# Patient Record
Sex: Female | Born: 1975 | Race: White | Hispanic: No | Marital: Married | Smoking: Never smoker
Health system: Southern US, Community
[De-identification: ages and names within clinical notes are randomized; demographics above are authoritative.]

## PROBLEM LIST (undated history)

## (undated) HISTORY — PX: APPENDECTOMY: SHX54

---

## 2001-12-07 HISTORY — PX: WISDOM TOOTH EXTRACTION: SHX21

## 2007-08-31 ENCOUNTER — Encounter: Payer: Self-pay | Admitting: Specialist

## 2007-09-07 ENCOUNTER — Encounter: Payer: Self-pay | Admitting: Specialist

## 2007-10-08 ENCOUNTER — Encounter: Payer: Self-pay | Admitting: Specialist

## 2009-07-05 ENCOUNTER — Ambulatory Visit: Payer: Self-pay | Admitting: Obstetrics & Gynecology

## 2009-07-05 LAB — CONVERTED CEMR LAB
Eosinophils Relative: 3 % (ref 0–5)
Lymphocytes Relative: 34 % (ref 12–46)
Lymphs Abs: 1.5 10*3/uL (ref 0.7–4.0)
MCHC: 31.9 g/dL (ref 30.0–36.0)
MCV: 89.4 fL (ref 78.0–100.0)
Monocytes Absolute: 0.4 10*3/uL (ref 0.1–1.0)
Neutrophils Relative %: 54 % (ref 43–77)
Platelets: 278 10*3/uL (ref 150–400)
RBC: 4.42 M/uL (ref 3.87–5.11)
RDW: 14.2 % (ref 11.5–15.5)

## 2009-07-19 ENCOUNTER — Ambulatory Visit (HOSPITAL_COMMUNITY): Admission: RE | Admit: 2009-07-19 | Discharge: 2009-07-19 | Payer: Self-pay | Admitting: Family Medicine

## 2009-07-23 ENCOUNTER — Encounter: Payer: Self-pay | Admitting: Obstetrics & Gynecology

## 2009-07-23 ENCOUNTER — Ambulatory Visit: Payer: Self-pay | Admitting: Obstetrics & Gynecology

## 2009-08-15 ENCOUNTER — Ambulatory Visit: Payer: Self-pay | Admitting: Obstetrics & Gynecology

## 2009-09-12 ENCOUNTER — Ambulatory Visit: Payer: Self-pay | Admitting: Obstetrics & Gynecology

## 2009-09-13 ENCOUNTER — Encounter: Payer: Self-pay | Admitting: Obstetrics & Gynecology

## 2009-10-01 ENCOUNTER — Ambulatory Visit (HOSPITAL_COMMUNITY): Admission: RE | Admit: 2009-10-01 | Discharge: 2009-10-01 | Payer: Self-pay | Admitting: Obstetrics and Gynecology

## 2009-10-10 ENCOUNTER — Ambulatory Visit: Payer: Self-pay | Admitting: Obstetrics & Gynecology

## 2009-10-24 ENCOUNTER — Ambulatory Visit (HOSPITAL_COMMUNITY): Admission: RE | Admit: 2009-10-24 | Discharge: 2009-10-24 | Payer: Self-pay | Admitting: Family Medicine

## 2009-11-07 ENCOUNTER — Ambulatory Visit: Payer: Self-pay | Admitting: Family Medicine

## 2009-12-05 ENCOUNTER — Ambulatory Visit: Payer: Self-pay | Admitting: Obstetrics & Gynecology

## 2009-12-05 LAB — CONVERTED CEMR LAB
HCT: 36.8 % (ref 36.0–46.0)
Hemoglobin: 12.7 g/dL (ref 12.0–15.0)
RBC: 4.36 M/uL (ref 3.87–5.11)
WBC: 7.1 10*3/uL (ref 4.0–10.5)

## 2009-12-20 ENCOUNTER — Ambulatory Visit: Payer: Self-pay | Admitting: Obstetrics & Gynecology

## 2009-12-24 ENCOUNTER — Ambulatory Visit: Payer: Self-pay | Admitting: Obstetrics & Gynecology

## 2010-01-08 ENCOUNTER — Ambulatory Visit: Payer: Self-pay | Admitting: Family Medicine

## 2010-01-23 ENCOUNTER — Ambulatory Visit: Payer: Self-pay | Admitting: Obstetrics & Gynecology

## 2010-02-06 ENCOUNTER — Ambulatory Visit: Payer: Self-pay | Admitting: Family Medicine

## 2010-02-07 ENCOUNTER — Encounter: Payer: Self-pay | Admitting: Family Medicine

## 2010-02-07 LAB — CONVERTED CEMR LAB
Chlamydia, DNA Probe: NEGATIVE
GC Probe Amp, Genital: NEGATIVE

## 2010-02-13 ENCOUNTER — Ambulatory Visit: Payer: Self-pay | Admitting: Family Medicine

## 2010-02-19 ENCOUNTER — Ambulatory Visit: Payer: Self-pay | Admitting: Family Medicine

## 2010-02-27 ENCOUNTER — Inpatient Hospital Stay (HOSPITAL_COMMUNITY): Admission: AD | Admit: 2010-02-27 | Discharge: 2010-03-01 | Payer: Self-pay | Admitting: Obstetrics & Gynecology

## 2010-02-27 ENCOUNTER — Ambulatory Visit: Payer: Self-pay | Admitting: Family

## 2010-02-27 ENCOUNTER — Ambulatory Visit: Payer: Self-pay | Admitting: Obstetrics & Gynecology

## 2010-04-17 ENCOUNTER — Ambulatory Visit: Payer: Self-pay | Admitting: Obstetrics and Gynecology

## 2011-03-02 LAB — CBC
Platelets: 268 10*3/uL (ref 150–400)
RDW: 13.7 % (ref 11.5–15.5)
WBC: 9.7 10*3/uL (ref 4.0–10.5)

## 2011-03-02 LAB — RPR: RPR Ser Ql: NONREACTIVE

## 2011-04-21 NOTE — Assessment & Plan Note (Signed)
NAME:  Heather Underwood, Heather Underwood               ACCOUNT NO.:  [id number]   MEDICAL RECORD NO.:  [id number]          PATIENT TYPE:  POB   LOCATION:  [facility]         FACILITY:  [facility]   PHYSICIAN:  Catalina Antigua, MD     DATE OF BIRTH:  Jan 22, 1976   DATE OF SERVICE:  04/17/2010                                  CLINIC NOTE   This is a 35 year old G4, P 4-0-0-4 who is status post vaginal delivery  on February 27, 2010, who presents today for a postpartum visit.  The  patient is currently without any complaints, currently bottle-feeding.  Her last period was Apr 11, 2010.  She reports receiving help from her  husband as well as family members.  The patient denies any signs or  symptoms of postpartum depression.  She has returned to work already and  is planning to use [place] for birth control.   PHYSICAL EXAMINATION:  VITAL SIGNS:  Her blood pressure is 128/80, pulse  of 81, weight of 290 pounds, height of 5 feet 7 inches.  LUNGS:  Clear to auscultation bilaterally.  HEART:  Regular rate and rhythm.  BREASTS:  Nontender, equal in size.  No engorgement.  No palpable masses  or lymphadenopathy.  ABDOMEN:  Soft, nontender, nondistended.  PELVIC:  Normal-appearing external genitalia.  Normal-appearing vaginal  mucosa and cervix.  No abnormal bleeding or discharge.  Bimanual exam,  no palpable adnexal masses or tenderness.   ASSESSMENT AND PLAN:  This is a 35 year old G4, P4, who is status post  vaginal delivery on February 27, 2010, who is here for postpartum visit.  The patient is medically cleared to resume all physical activities.  Prescription for Solmon Ice was provided.  The patient will return in  August for annual exam or p.r.n.           ______________________________  Catalina Antigua, MD     PC/MEDQ  D:  04/17/2010  T:  04/18/2010  Job:  [phone]

## 2012-02-16 IMAGING — US US OB FOLLOW-UP
1 series · 14 of 26 positions shown · non-contrast
Comparison: none

OBSTETRICAL ULTRASOUND:
 This ultrasound exam was performed in the [HOSPITAL] Ultrasound Department.  The OB US report was generated in the AS system, and faxed to the ordering physician.  This report is also available in [HOSPITAL]?s [employer] and in [REDACTED] PACS.

[Series 1: us ob follow up · 0.30mm/px · 14 of 26 slices shown]
[im 1/26]
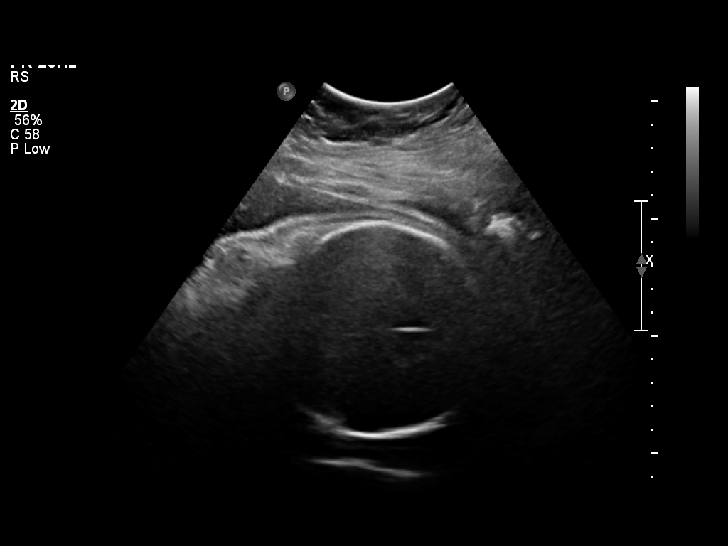
[im 3/26]
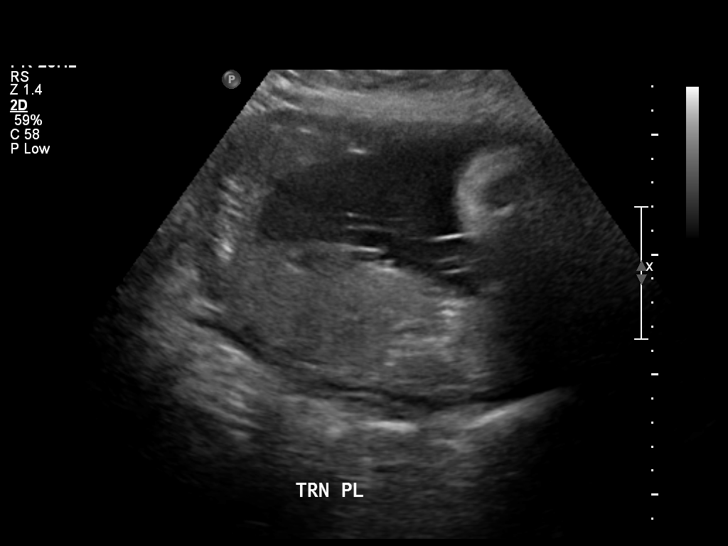
[im 5/26]
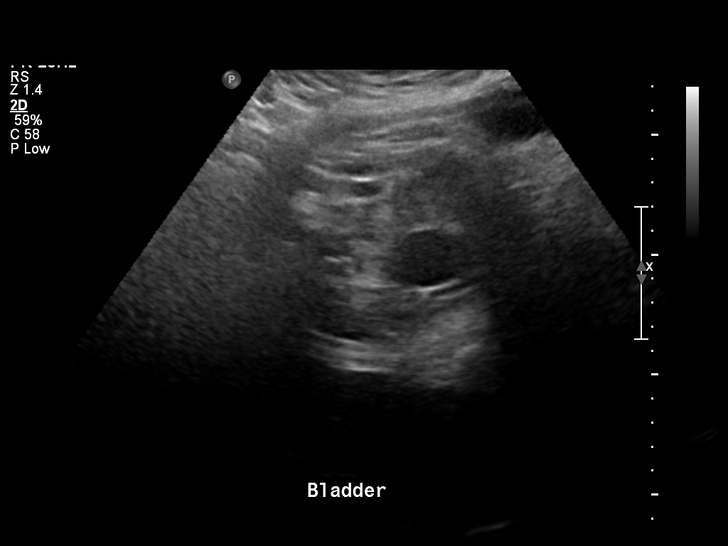
[im 7/26]
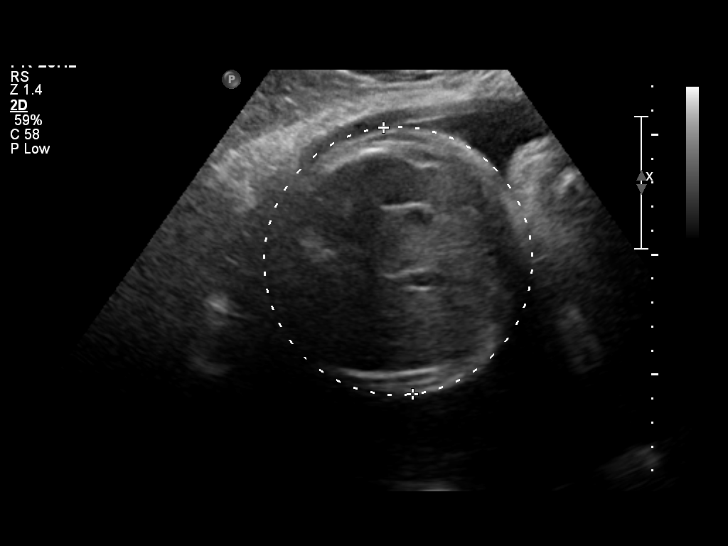
[im 9/26]
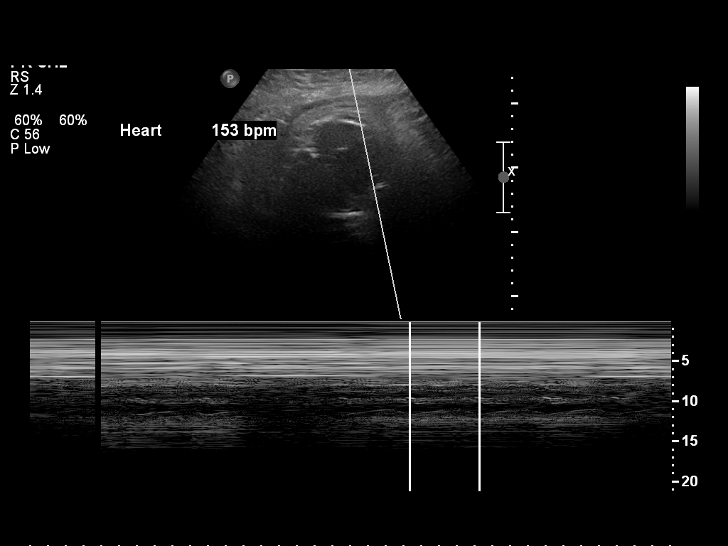
[im 11/26]
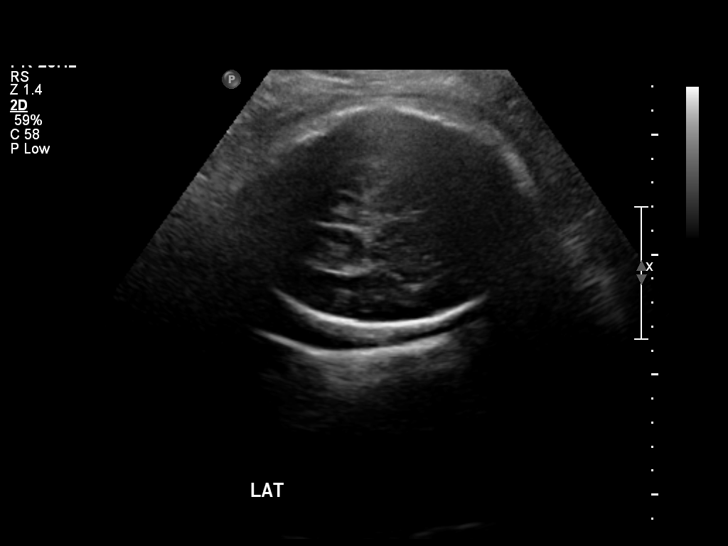
[im 13/26]
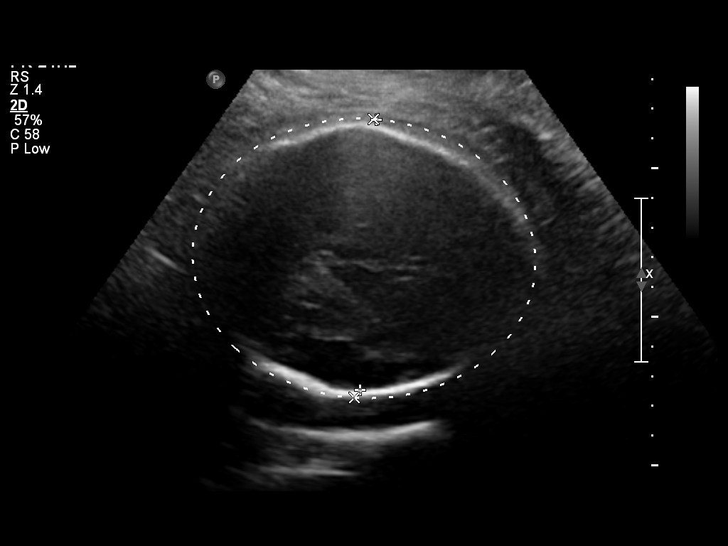
[im 14/26]
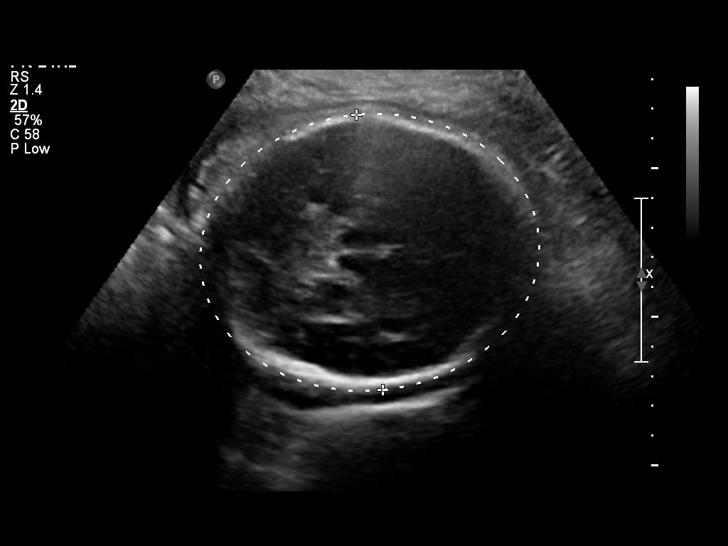
[im 16/26]
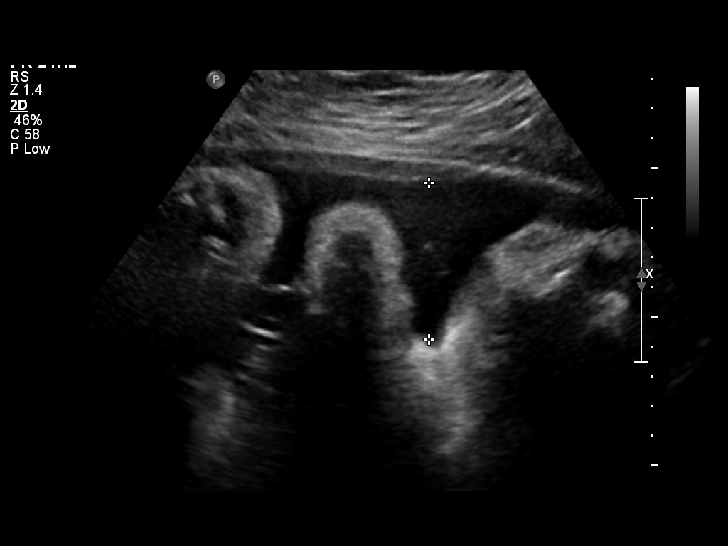
[im 18/26]
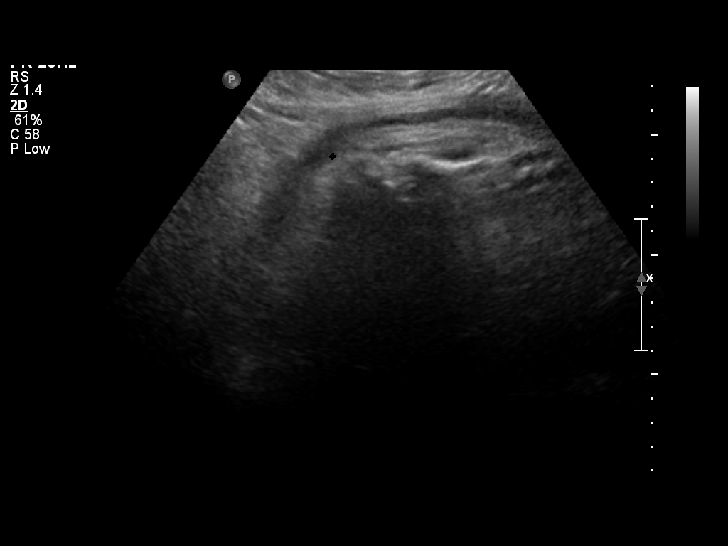
[im 20/26]
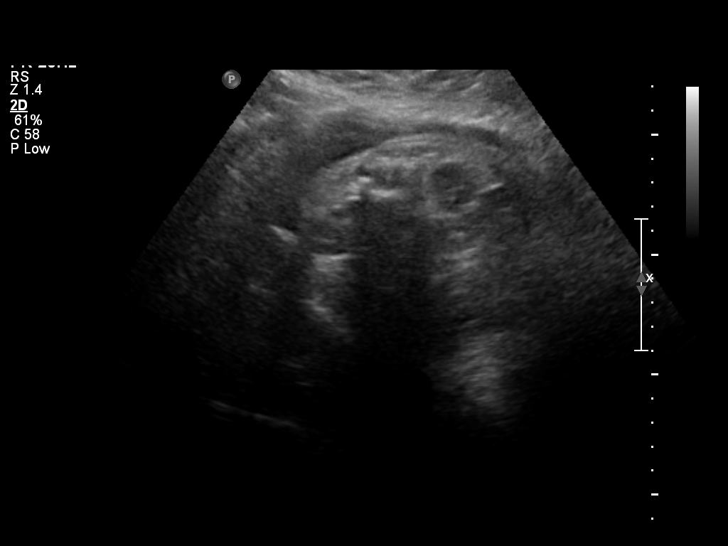
[im 22/26]
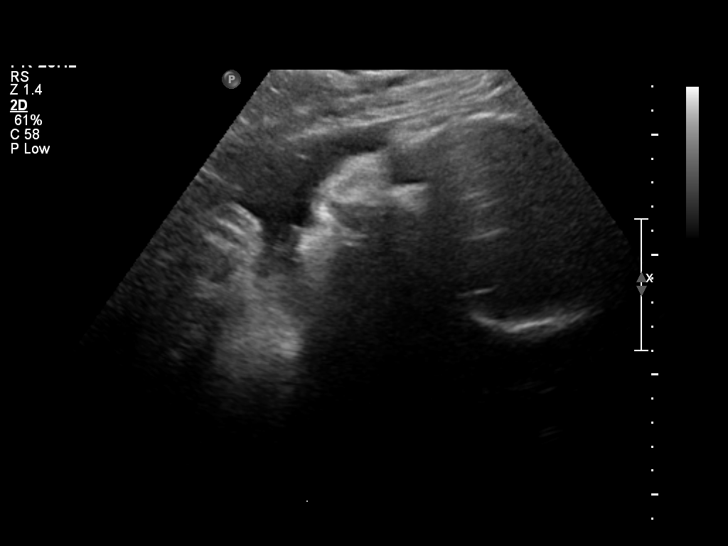
[im 24/26]
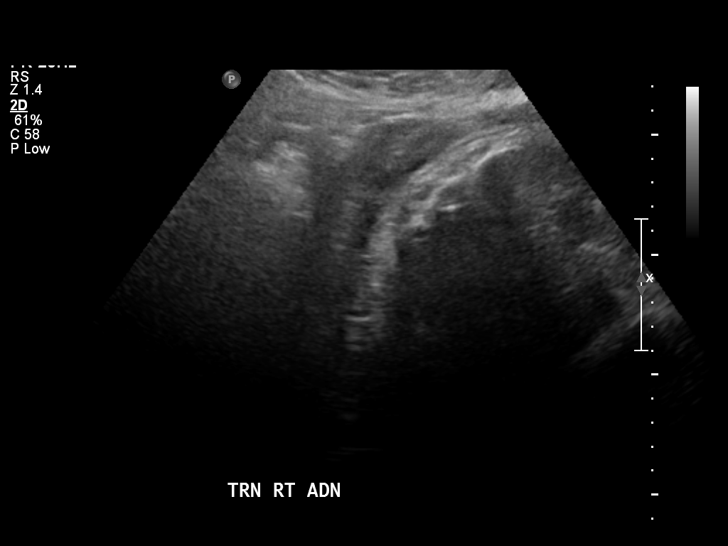
[im 26/26]
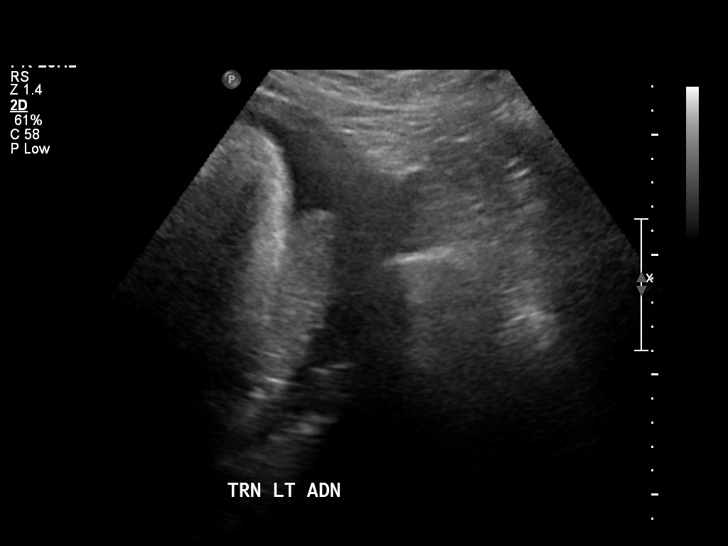

[14 of 26 positions shown; findings below may reference images not displayed]

IMPRESSION: See AS Obstetric US report.

## 2015-12-19 ENCOUNTER — Encounter: Payer: Self-pay | Admitting: Obstetrics and Gynecology

## 2015-12-19 ENCOUNTER — Ambulatory Visit (INDEPENDENT_AMBULATORY_CARE_PROVIDER_SITE_OTHER): Payer: Managed Care, Other (non HMO) | Admitting: Obstetrics and Gynecology

## 2015-12-19 VITALS — BP 138/87 | HR 111 | Ht 67.0 in | Wt 283.0 lb

## 2015-12-19 DIAGNOSIS — Z01419 Encounter for gynecological examination (general) (routine) without abnormal findings: Secondary | ICD-10-CM

## 2015-12-19 DIAGNOSIS — Z1151 Encounter for screening for human papillomavirus (HPV): Secondary | ICD-10-CM | POA: Diagnosis not present

## 2015-12-19 DIAGNOSIS — Z124 Encounter for screening for malignant neoplasm of cervix: Secondary | ICD-10-CM | POA: Diagnosis not present

## 2015-12-19 NOTE — Progress Notes (Signed)
  Subjective:     Heather Underwood is a 40 y.o. female 654P4 with BMI 44 who is here for a comprehensive physical exam and contraception management. The patient reports no problems. She reports monthly cycles lasting 7 days every 28 days. She is sexually active without contraception. She recently used plan B. She is interested in re-iniating birth control Mirena IUD but OCP for now.  No past medical history on file. No past surgical history on file. No family history on file.  Social History   Social History  . Marital Status: Married    Spouse Name: N/A  . Number of Children: N/A  . Years of Education: N/A   Occupational History  . Not on file.   Social History Main Topics  . Smoking status: Not on file  . Smokeless tobacco: Not on file  . Alcohol Use: Not on file  . Drug Use: Not on file  . Sexual Activity: Not on file   Other Topics Concern  . Not on file   Social History Narrative  . No narrative on file   Health Maintenance  Topic Date Due  . TETANUS/TDAP  11/21/1994  . PAP SMEAR  11/21/1996  . INFLUENZA VACCINE  07/08/2015  . HIV Screening  Completed       Review of Systems Pertinent items are noted in HPI.   Objective:      GENERAL: Well-developed, well-nourished female in no acute distress. Obese HEENT: Normocephalic, atraumatic. Sclerae anicteric.  NECK: Supple. Normal thyroid.  LUNGS: Clear to auscultation bilaterally.  HEART: Regular rate and rhythm. BREASTS: Symmetric in size. No palpable masses or lymphadenopathy, skin changes, or nipple drainage. ABDOMEN: Soft, nontender, nondistended. No organomegaly. PELVIC: Normal external female genitalia. Vagina is pink and rugated.  Normal discharge. Normal appearing cervix. Uterus is normal in size. No adnexal mass or tenderness. EXTREMITIES: No cyanosis, clubbing, or edema, 2+ distal pulses.    Assessment:    Healthy female exam.      Plan:    Pap smear collected Referral for mammography  provided Advised patient to abstain from unprotected intercourse for 2 weeks. If negative pregnancy test a Rx Sprintec will be e-prescribed Patient advised to perform monthly self breast exam Patient will be contacted with any abnormal results See After Visit Summary for Counseling Recommendations

## 2015-12-24 LAB — CYTOLOGY - PAP

## 2016-01-01 ENCOUNTER — Telehealth: Payer: Self-pay | Admitting: *Deleted

## 2016-01-01 DIAGNOSIS — Z30011 Encounter for initial prescription of contraceptive pills: Secondary | ICD-10-CM

## 2016-01-01 MED ORDER — LEVONORGEST-ETH ESTRAD 91-DAY 0.15-0.03 &0.01 MG PO TABS: 1.0000 | ORAL_TABLET | Freq: Every day | ORAL | Status: DC

## 2016-01-01 NOTE — Telephone Encounter (Signed)
-----   Message from Olevia Bowens sent at 01/01/2016  9:15 AM EST ----- Regarding: Rx Request Contact: [phone] Patient called stated she had a visit with [place] about 2wks ago, told her to wait two weeks and take a pregnancy test before they would prescribe her birth control, she states she took one today it was negative, would like  [place] called in [address] on [address] in [place]

## 2016-02-28 ENCOUNTER — Telehealth: Payer: Self-pay | Admitting: *Deleted

## 2016-02-28 NOTE — Telephone Encounter (Signed)
Returned call to pt, no answer, unable to leave message (mailbox full).

## 2016-03-02 ENCOUNTER — Telehealth: Payer: Self-pay | Admitting: *Deleted

## 2016-03-02 DIAGNOSIS — Z3041 Encounter for surveillance of contraceptive pills: Secondary | ICD-10-CM

## 2016-03-02 MED ORDER — LEVONORGEST-ETH ESTRAD 91-DAY 0.1-0.02 & 0.01 MG PO TABS: 1.0000 | ORAL_TABLET | Freq: Every day | ORAL | Status: DC

## 2016-03-02 NOTE — Telephone Encounter (Signed)
Pt started Seasonique back in January, has been experiencing break through bleeding for the past few weeks and feels she is getting too much hormones from the LewisSeasonique, requesting a lower dose pill that is three months active cycle.  Sent Camrese Lo to pharmacy.  Informed pt to finish Seasonique and start Camrese Lo afterwards.  Also recommended that if she experiences the same side effect with the Camrese Lo that she may need to switch to a pill similar to Lo Estrin that is 3 weeks active instead of 3 months active pill to give her body a break throughout the month.  Pt acknowledged instructions.

## 2016-06-10 ENCOUNTER — Telehealth: Payer: Self-pay | Admitting: *Deleted

## 2016-06-10 DIAGNOSIS — Z3041 Encounter for surveillance of contraceptive pills: Secondary | ICD-10-CM

## 2016-06-10 MED ORDER — NORGESTIMATE-ETH ESTRADIOL 0.25-35 MG-MCG PO TABS: 1.0000 | ORAL_TABLET | Freq: Every day | ORAL | Status: DC

## 2016-06-10 NOTE — Telephone Encounter (Signed)
Pt still experiencing break through bleeding while using the Camrese Lo, sent rx for Sprintec to the pharmacy per Dr Jolayne Pantheronstant last office note.  Pt will try the Sprintec over the next few months and call back with any complications.

## 2016-09-11 ENCOUNTER — Telehealth: Payer: Self-pay | Admitting: *Deleted

## 2016-09-11 DIAGNOSIS — Z309 Encounter for contraceptive management, unspecified: Secondary | ICD-10-CM

## 2016-09-11 DIAGNOSIS — Z3041 Encounter for surveillance of contraceptive pills: Secondary | ICD-10-CM

## 2016-09-11 MED ORDER — NORGESTREL-ETHINYL ESTRADIOL 0.3-30 MG-MCG PO TABS: 1.0000 | ORAL_TABLET | Freq: Every day | ORAL | 11 refills | Status: DC

## 2016-09-11 NOTE — Telephone Encounter (Signed)
Spoke with Dr Marice Potterove who suggested Lo/Ovral to see if that will resolve BTB. Tried calling patient to let her know Rx at Pharmacy but her VM is full and can't receive messages. Tried calling pharmacy at [phone] and they still have the phones on night mode stating pharmacy will open at 0800 but no one is answering.

## 2016-09-11 NOTE — Telephone Encounter (Signed)
-----   Message from Heather BowensJacinda S Battle sent at 09/10/2016 11:19 AM EDT ----- Regarding: RX Request [facility] on [address] called about this patient, patient wants to know if we could switch her OCP, having a lot of break through bleeding

## 2016-11-19 ENCOUNTER — Telehealth: Payer: Self-pay

## 2016-11-19 NOTE — Telephone Encounter (Signed)
Patient name on January 2018 wait list to schedule an annual exam, called patient to schedule an appointment. No answer, voicemail box not set up, no way to leave a message mailed a postcard reminder to home address "time to schedule an appointment, contact our office"

## 2017-06-21 ENCOUNTER — Other Ambulatory Visit: Payer: Self-pay | Admitting: Obstetrics & Gynecology

## 2017-06-21 DIAGNOSIS — Z309 Encounter for contraceptive management, unspecified: Secondary | ICD-10-CM

## 2017-06-24 ENCOUNTER — Ambulatory Visit (INDEPENDENT_AMBULATORY_CARE_PROVIDER_SITE_OTHER): Payer: Managed Care, Other (non HMO) | Admitting: Family Medicine

## 2017-06-24 ENCOUNTER — Encounter: Payer: Self-pay | Admitting: Family Medicine

## 2017-06-24 VITALS — BP 129/85 | HR 118 | Resp 18 | Ht 67.0 in | Wt 285.0 lb

## 2017-06-24 DIAGNOSIS — Z30430 Encounter for insertion of intrauterine contraceptive device: Secondary | ICD-10-CM

## 2017-06-24 DIAGNOSIS — Z01419 Encounter for gynecological examination (general) (routine) without abnormal findings: Secondary | ICD-10-CM

## 2017-06-24 DIAGNOSIS — Z124 Encounter for screening for malignant neoplasm of cervix: Secondary | ICD-10-CM | POA: Diagnosis not present

## 2017-06-24 DIAGNOSIS — Z3202 Encounter for pregnancy test, result negative: Secondary | ICD-10-CM | POA: Diagnosis not present

## 2017-06-24 DIAGNOSIS — Z309 Encounter for contraceptive management, unspecified: Secondary | ICD-10-CM

## 2017-06-24 LAB — POCT URINE PREGNANCY: PREG TEST UR: NEGATIVE

## 2017-06-24 MED ORDER — LEVONORGESTREL 18.6 MCG/DAY IU IUD
INTRAUTERINE_SYSTEM | Freq: Once | INTRAUTERINE | Status: AC
  Administered 2017-06-24: 10:00:00 via INTRAUTERINE

## 2017-06-24 MED ORDER — NORGESTREL-ETHINYL ESTRADIOL 0.3-30 MG-MCG PO TABS: 1.0000 | ORAL_TABLET | Freq: Every day | ORAL | 1 refills | Status: DC

## 2017-06-24 NOTE — Patient Instructions (Signed)

## 2017-06-24 NOTE — Progress Notes (Signed)
GYNECOLOGY ANNUAL PREVENTATIVE CARE ENCOUNTER NOTE  Subjective:   Collier FlowersChristy L Justin is a 41 y.o. [phone] female here for a routine annual gynecologic exam.  Current complaints: none.   Denies abnormal vaginal bleeding, discharge, pelvic pain, problems with intercourse or other gynecologic concerns.     Gynecologic History Patient's last menstrual period was 06/01/2017. Patient is sexually active  Contraception: OCP (estrogen/progesterone) - would like to have BTL or LARC Last Pap: 2017. Results were: normal Last mammogram: n/a.   Obstetric History OB History  Gravida Para Term Preterm AB Living  5 4 4   1 4   SAB TAB Ectopic Multiple Live Births  1       4    # Outcome Date GA Lbr Len/2nd Weight Sex Delivery Anes PTL Lv  5 Term 02/27/09 5657w0d  7 lb 9 oz (3.43 kg) F Vag-Spont   LIV  4 Term 12/13/01 5961w0d  5 lb 3 oz (2.353 kg) M Vag-Spont   LIV  3 SAB 2002 3022w0d       ND  2 Term 03/11/99 7374w0d  9 lb 8 oz (4.309 kg) M Vag-Spont   LIV  1 Term 09/17/95 6074w0d  8 lb 7 oz (3.827 kg) F Vag-Forceps   LIV      History reviewed. No pertinent past medical history.  Past Surgical History:  Procedure Laterality Date  . APPENDECTOMY    . WISDOM TOOTH EXTRACTION  2003    No current outpatient prescriptions on file prior to visit.   No current facility-administered medications on file prior to visit.     No Known Allergies  Social History   Social History  . Marital status: Married    Spouse name: N/A  . Number of children: N/A  . Years of education: N/A   Occupational History  . Not on file.   Social History Main Topics  . Smoking status: Never Smoker  . Smokeless tobacco: Never Used  . Alcohol use Yes     Comment: occasional  . Drug use: No  . Sexual activity: Yes    Birth control/ protection: Pill   Other Topics Concern  . Not on file   Social History Narrative  . No narrative on file    Family History  Problem Relation Age of Onset  . Diverticulitis Mother    . Alcohol abuse Father   . Liver disease Father   . Cancer Father [phone]    The following portions of the patient's history were reviewed and updated as appropriate: allergies, current medications, past family history, past medical history, past social history, past surgical history and problem list.  Review of Systems Pertinent items are noted in HPI.   Objective:  BP 129/85   Pulse (!) 118   Resp 18   Ht 5\' 7"  (1.702 m)   Wt 285 lb (129.3 kg)   LMP 06/01/2017   BMI 44.64 kg/m  CONSTITUTIONAL: Well-developed, well-nourished female in no acute distress.  HENT:  Normocephalic, atraumatic, External right and left ear normal. Oropharynx is clear and moist EYES: Conjunctivae and EOM are normal. Pupils are equal, round, and reactive to light. No scleral icterus.  NECK: Normal range of motion, supple, no masses.  Normal thyroid.   CARDIOVASCULAR: Normal heart rate noted, regular rhythm RESPIRATORY: Clear to auscultation bilaterally. Effort and breath sounds normal, no problems with respiration noted. BREASTS: Symmetric in size. No masses, skin changes, nipple drainage, or lymphadenopathy. ABDOMEN: Soft, normal bowel sounds, no distention noted.  No tenderness, rebound or guarding.  PELVIC: Normal appearing external genitalia; normal appearing vaginal mucosa and cervix.  No abnormal discharge noted.  Pap smear obtained.  Normal uterine size, no other palpable masses, no uterine or adnexal tenderness. MUSCULOSKELETAL: Normal range of motion. No tenderness.  No cyanosis, clubbing, or edema.  2+ distal pulses. SKIN: Skin is warm and dry. No rash noted. Not diaphoretic. No erythema. No pallor. NEUROLOGIC: Alert and oriented to person, place, and time. Normal reflexes, muscle tone coordination. No cranial nerve deficit noted. PSYCHIATRIC: Normal mood and affect. Normal behavior. Normal judgment and thought content.  IUD Procedure Note Patient identified, informed consent performed, signed copy  in chart, time out was performed.  Urine pregnancy test negative.  Speculum placed in the vagina.  Cervix visualized.  Cleaned with Betadine x 2.  Grasped anteriorly with a single tooth tenaculum.  Uterus sounded to 10 cm.  Liletta  IUD placed per manufacturer's recommendations.  Strings trimmed to 3 cm. Tenaculum was removed, good hemostasis noted.  Patient tolerated procedure well.   Patient given post procedure instructions and Liletta care card with expiration date.  Patient is asked to check IUD strings periodically and follow up in 4-6 weeks for IUD check.   Assessment:  Annual gynecologic examination with pap smear   Plan:  Will follow up results of pap smear and manage accordingly. Mammogram scheduled STD testing discussed. Patient declined testing Discussed IUD vs BTL - pt would like IUD. Inserted today. Pt would like to use OCPs for 1 month following insertion to make sure bleeding is okay.  Discussed exercise and diet Calcium and Vitamin D supplementation discussed  Routine preventative health maintenance measures emphasized. Please refer to After Visit Summary for other counseling recommendations.    Candelaria Celeste, DO [facility]

## 2017-07-05 ENCOUNTER — Telehealth: Payer: Self-pay | Admitting: Radiology

## 2017-07-05 NOTE — Telephone Encounter (Signed)
Left voicemail for patient to call back to [id number] explaining that the only insurance we have on file is for vision. Need other medical insurance for last visit to be filed otherwise, patient will be considered self-pay.

## 2017-07-22 ENCOUNTER — Encounter: Payer: Self-pay | Admitting: Obstetrics and Gynecology

## 2017-07-22 ENCOUNTER — Ambulatory Visit (INDEPENDENT_AMBULATORY_CARE_PROVIDER_SITE_OTHER): Payer: Managed Care, Other (non HMO) | Admitting: Obstetrics and Gynecology

## 2017-07-22 VITALS — BP 138/86 | HR 109 | Wt 279.0 lb

## 2017-07-22 DIAGNOSIS — Z30431 Encounter for routine checking of intrauterine contraceptive device: Secondary | ICD-10-CM

## 2017-07-22 NOTE — Progress Notes (Signed)
Obstetrics and Gynecology Visit Return Patient Evaluation  Appointment Date: 07/22/2017  Chief Complaint: IUD string check   History of Present Illness:  Collier FlowersChristy L Norlander is a 41 y.o. s/p 7/19 Liletta IUD insertion at her annual visit, for contraception; she was previously on OCPs. 2017 pap negative (no ec/tz) but negative HPV.   Interval History: she overlapped the OCPs until about a week ago. She's had some AUB since having the IUD inserted but it's tapering down a lot; she had a period (at the usual time per her OCP pack) about a week after insertion and no issues and she's had intercourse since having the IUD inserted and no issues with that either.   Review of Systems:  as noted in the History of Present Illness.  The following portions of the patient's history were reviewed and updated as appropriate: allergies, current medications, past family history, past medical history, past social history, past surgical history and problem list.  Medications: Liletta IUD Allergies: has No Known Allergies.  Physical Exam:  BP 138/86   Pulse (!) 109   Wt 279 lb (126.6 kg)   BMI 43.70 kg/m  Body mass index is 43.7 kg/m. General appearance: Well nourished, well developed female in no acute distress.  Abdomen: diffusely non tender to palpation, non distended, and no masses, hernias Neuro/Psych:  Normal mood and affect.    Pelvic exam:  Two IUD strings present seen coming from the cervical os, approx 3cm and tucked into the fornices EGBUS, vaginal vault and cervix: within normal limits   Assessment: IUD strings present in proper location; pt doing well  Plan: Pt told that it's normal that she can have some AUB initially but it's reassuring that it's decreasing. Pt told to let us know if still having any AUB after 2-3 months s/p insertion  RTC: 1 year  Cornelia Copaharlie Stavroula Rohde, Jr MD Attending [facility](Faculty Practice)

## 2018-04-20 ENCOUNTER — Encounter: Payer: Self-pay | Admitting: Radiology

## 2018-05-19 ENCOUNTER — Encounter: Payer: Self-pay | Admitting: Radiology
